# Patient Record
Sex: Male | Born: 1935 | Race: Black or African American | Hispanic: No | Marital: Married | Smoking: Former smoker
Health system: Southern US, Community
[De-identification: ages and names within clinical notes are randomized; demographics above are authoritative.]

## PROBLEM LIST (undated history)

## (undated) DIAGNOSIS — I10 Essential (primary) hypertension: Secondary | ICD-10-CM

## (undated) DIAGNOSIS — G4730 Sleep apnea, unspecified: Secondary | ICD-10-CM

## (undated) DIAGNOSIS — D649 Anemia, unspecified: Secondary | ICD-10-CM

## (undated) DIAGNOSIS — I739 Peripheral vascular disease, unspecified: Secondary | ICD-10-CM

## (undated) DIAGNOSIS — E039 Hypothyroidism, unspecified: Secondary | ICD-10-CM

## (undated) DIAGNOSIS — E119 Type 2 diabetes mellitus without complications: Secondary | ICD-10-CM

## (undated) DIAGNOSIS — D759 Disease of blood and blood-forming organs, unspecified: Secondary | ICD-10-CM

## (undated) DIAGNOSIS — N189 Chronic kidney disease, unspecified: Secondary | ICD-10-CM

## (undated) HISTORY — PX: APPENDECTOMY: SHX54

## (undated) HISTORY — PX: NECK SURGERY: SHX720

---

## 2007-03-02 ENCOUNTER — Ambulatory Visit: Payer: Self-pay | Admitting: Ophthalmology

## 2007-03-08 ENCOUNTER — Ambulatory Visit: Payer: Self-pay | Admitting: Ophthalmology

## 2007-12-05 ENCOUNTER — Other Ambulatory Visit: Payer: Self-pay

## 2007-12-05 ENCOUNTER — Ambulatory Visit: Payer: Self-pay | Admitting: Ophthalmology

## 2007-12-21 ENCOUNTER — Ambulatory Visit: Payer: Self-pay | Admitting: Ophthalmology

## 2008-03-19 ENCOUNTER — Ambulatory Visit: Payer: Self-pay | Admitting: Urology

## 2008-04-19 ENCOUNTER — Ambulatory Visit: Payer: Self-pay | Admitting: Urology

## 2008-04-19 ENCOUNTER — Other Ambulatory Visit: Payer: Self-pay

## 2009-02-26 ENCOUNTER — Ambulatory Visit: Payer: Self-pay | Admitting: Gastroenterology

## 2009-05-01 ENCOUNTER — Ambulatory Visit: Payer: Self-pay | Admitting: Urology

## 2009-05-14 ENCOUNTER — Ambulatory Visit: Payer: Self-pay | Admitting: Urology

## 2009-11-18 ENCOUNTER — Ambulatory Visit: Payer: Self-pay | Admitting: Unknown Physician Specialty

## 2012-12-09 ENCOUNTER — Ambulatory Visit: Payer: Self-pay | Admitting: Gastroenterology

## 2012-12-12 LAB — PATHOLOGY REPORT

## 2015-12-11 ENCOUNTER — Other Ambulatory Visit: Payer: Self-pay | Admitting: Internal Medicine

## 2015-12-11 DIAGNOSIS — H539 Unspecified visual disturbance: Secondary | ICD-10-CM

## 2015-12-21 ENCOUNTER — Ambulatory Visit
Admission: RE | Admit: 2015-12-21 | Discharge: 2015-12-21 | Disposition: A | Discharge status: Home / Self Care | Payer: Medicare Other | Source: Ambulatory Visit | Attending: Internal Medicine | Admitting: Internal Medicine

## 2015-12-21 DIAGNOSIS — H539 Unspecified visual disturbance: Secondary | ICD-10-CM

## 2015-12-31 ENCOUNTER — Ambulatory Visit: Payer: Self-pay

## 2016-02-21 ENCOUNTER — Encounter: Payer: Self-pay | Admitting: *Deleted

## 2016-02-24 ENCOUNTER — Encounter
Admission: RE | Disposition: A | Discharge status: Home / Self Care | Payer: Self-pay | Source: Ambulatory Visit | Attending: Gastroenterology

## 2016-02-24 ENCOUNTER — Ambulatory Visit: Payer: Medicare Other | Admitting: Anesthesiology

## 2016-02-24 ENCOUNTER — Ambulatory Visit
Admission: RE | Admit: 2016-02-24 | Discharge: 2016-02-24 | Disposition: A | Discharge status: Home / Self Care | Payer: Medicare Other | Source: Ambulatory Visit | Attending: Gastroenterology | Admitting: Gastroenterology

## 2016-02-24 DIAGNOSIS — N189 Chronic kidney disease, unspecified: Secondary | ICD-10-CM | POA: Insufficient documentation

## 2016-02-24 DIAGNOSIS — I739 Peripheral vascular disease, unspecified: Secondary | ICD-10-CM | POA: Diagnosis not present

## 2016-02-24 DIAGNOSIS — Z800 Family history of malignant neoplasm of digestive organs: Secondary | ICD-10-CM | POA: Insufficient documentation

## 2016-02-24 DIAGNOSIS — Z794 Long term (current) use of insulin: Secondary | ICD-10-CM | POA: Insufficient documentation

## 2016-02-24 DIAGNOSIS — E1122 Type 2 diabetes mellitus with diabetic chronic kidney disease: Secondary | ICD-10-CM | POA: Insufficient documentation

## 2016-02-24 DIAGNOSIS — Z7982 Long term (current) use of aspirin: Secondary | ICD-10-CM | POA: Diagnosis not present

## 2016-02-24 DIAGNOSIS — Z87891 Personal history of nicotine dependence: Secondary | ICD-10-CM | POA: Diagnosis not present

## 2016-02-24 DIAGNOSIS — D649 Anemia, unspecified: Secondary | ICD-10-CM | POA: Diagnosis not present

## 2016-02-24 DIAGNOSIS — Q438 Other specified congenital malformations of intestine: Secondary | ICD-10-CM | POA: Insufficient documentation

## 2016-02-24 DIAGNOSIS — K5730 Diverticulosis of large intestine without perforation or abscess without bleeding: Secondary | ICD-10-CM | POA: Insufficient documentation

## 2016-02-24 DIAGNOSIS — D125 Benign neoplasm of sigmoid colon: Secondary | ICD-10-CM | POA: Diagnosis not present

## 2016-02-24 DIAGNOSIS — E039 Hypothyroidism, unspecified: Secondary | ICD-10-CM | POA: Insufficient documentation

## 2016-02-24 DIAGNOSIS — Z86010 Personal history of colonic polyps: Secondary | ICD-10-CM | POA: Diagnosis present

## 2016-02-24 DIAGNOSIS — I129 Hypertensive chronic kidney disease with stage 1 through stage 4 chronic kidney disease, or unspecified chronic kidney disease: Secondary | ICD-10-CM | POA: Diagnosis not present

## 2016-02-24 DIAGNOSIS — D120 Benign neoplasm of cecum: Secondary | ICD-10-CM | POA: Diagnosis not present

## 2016-02-24 DIAGNOSIS — Z1211 Encounter for screening for malignant neoplasm of colon: Secondary | ICD-10-CM | POA: Insufficient documentation

## 2016-02-24 DIAGNOSIS — K529 Noninfective gastroenteritis and colitis, unspecified: Secondary | ICD-10-CM | POA: Insufficient documentation

## 2016-02-24 DIAGNOSIS — D123 Benign neoplasm of transverse colon: Secondary | ICD-10-CM | POA: Diagnosis not present

## 2016-02-24 DIAGNOSIS — Z79899 Other long term (current) drug therapy: Secondary | ICD-10-CM | POA: Diagnosis not present

## 2016-02-24 DIAGNOSIS — M109 Gout, unspecified: Secondary | ICD-10-CM | POA: Insufficient documentation

## 2016-02-24 DIAGNOSIS — G4730 Sleep apnea, unspecified: Secondary | ICD-10-CM | POA: Insufficient documentation

## 2016-02-24 HISTORY — PX: COLONOSCOPY WITH PROPOFOL: SHX5780

## 2016-02-24 HISTORY — DX: Sleep apnea, unspecified: G47.30

## 2016-02-24 HISTORY — DX: Anemia, unspecified: D64.9

## 2016-02-24 HISTORY — DX: Peripheral vascular disease, unspecified: I73.9

## 2016-02-24 HISTORY — DX: Hypothyroidism, unspecified: E03.9

## 2016-02-24 HISTORY — DX: Chronic kidney disease, unspecified: N18.9

## 2016-02-24 HISTORY — DX: Disease of blood and blood-forming organs, unspecified: D75.9

## 2016-02-24 HISTORY — DX: Essential (primary) hypertension: I10

## 2016-02-24 HISTORY — DX: Type 2 diabetes mellitus without complications: E11.9

## 2016-02-24 LAB — GLUCOSE, CAPILLARY: GLUCOSE-CAPILLARY: 115 mg/dL — AB (ref 65–99)

## 2016-02-24 SURGERY — COLONOSCOPY WITH PROPOFOL
Anesthesia: General

## 2016-02-24 MED ORDER — SODIUM CHLORIDE 0.9 % IV SOLN: INTRAVENOUS | Status: DC

## 2016-02-24 MED ORDER — LIDOCAINE HCL (CARDIAC) 20 MG/ML IV SOLN
INTRAVENOUS | Status: DC | PRN
  Administered 2016-02-24: 30 mg via INTRAVENOUS

## 2016-02-24 MED ORDER — EPHEDRINE SULFATE 50 MG/ML IJ SOLN
INTRAMUSCULAR | Status: DC | PRN
  Administered 2016-02-24: 5 mg via INTRAVENOUS

## 2016-02-24 MED ORDER — PROPOFOL 500 MG/50ML IV EMUL
INTRAVENOUS | Status: DC | PRN
Start: 2016-02-24 — End: 2016-02-24
  Administered 2016-02-24: 160 ug/kg/min via INTRAVENOUS

## 2016-02-24 MED ORDER — SODIUM CHLORIDE 0.9 % IV SOLN
INTRAVENOUS | Status: DC
  Administered 2016-02-24: 1000 mL via INTRAVENOUS

## 2016-02-24 MED ORDER — MIDAZOLAM HCL 5 MG/5ML IJ SOLN
INTRAMUSCULAR | Status: DC | PRN
  Administered 2016-02-24: 1 mg via INTRAVENOUS

## 2016-02-24 MED ORDER — FENTANYL CITRATE (PF) 100 MCG/2ML IJ SOLN
INTRAMUSCULAR | Status: DC | PRN
  Administered 2016-02-24: 50 ug via INTRAVENOUS

## 2016-02-24 MED ORDER — PROPOFOL 10 MG/ML IV BOLUS
INTRAVENOUS | Status: DC | PRN
Start: 2016-02-24 — End: 2016-02-24
  Administered 2016-02-24: 50 mg via INTRAVENOUS

## 2016-02-24 MED ORDER — LABETALOL HCL 5 MG/ML IV SOLN
INTRAVENOUS | Status: DC | PRN
  Administered 2016-02-24 (×2): 5 mg via INTRAVENOUS
  Administered 2016-02-24: 10 mg via INTRAVENOUS

## 2016-02-24 NOTE — H&P (Signed)
Outpatient short stay form Pre-procedure 02/24/2016 7:34 AM Lollie Sails MD  Primary Physician: Dr. Ramonita Lab  Reason for visit:  Colonoscopy  History of present illness:  Patient is a 82 year old male presenting today for colonoscopy. He has a family history of colon cancer primary relative as well as personal history of adenomatous colon polyps. He tolerated his prep well. He takes a 81 mg aspirin that has been held for several days. He takes no other aspirin or blood thinning products.    Current facility-administered medications:  .  0.9 %  sodium chloride infusion, , Intravenous, Continuous, Lollie Sails, MD, Last Rate: 20 mL/hr at 02/24/16 0704, 1,000 mL at 02/24/16 0704 .  0.9 %  sodium chloride infusion, , Intravenous, Continuous, Lollie Sails, MD  Prescriptions prior to admission  Medication Sig Dispense Refill Last Dose  . aspirin (ASPIRIN EC) 81 MG EC tablet Take 81 mg by mouth daily. Swallow whole.     Marland Kitchen atorvastatin (LIPITOR) 40 MG tablet Take 40 mg by mouth daily.     . bimatoprost (LUMIGAN) 0.03 % ophthalmic solution Place 1 drop into both eyes at bedtime.     . colchicine 0.6 MG tablet Take 0.6 mg by mouth as needed.     . docusate calcium (SURFAK) 240 MG capsule Take 240 mg by mouth daily as needed for mild constipation.     . dorzolamide-timolol (COSOPT) 22.3-6.8 MG/ML ophthalmic solution Place 1 drop into both eyes 2 (two) times daily.     . finasteride (PROSCAR) 5 MG tablet Take 5 mg by mouth daily.     . insulin glargine (LANTUS) 100 UNIT/ML injection Inject 18 Units into the skin at bedtime.     Marland Kitchen ketorolac (ACULAR) 0.4 % SOLN 1 drop 4 (four) times daily.     Marland Kitchen latanoprost (XALATAN) 0.005 % ophthalmic solution Place 1 drop into both eyes at bedtime.     Marland Kitchen levothyroxine (SYNTHROID, LEVOTHROID) 75 MCG tablet Take 75 mcg by mouth daily before breakfast.     . prednisoLONE acetate (PRED FORTE) 1 % ophthalmic suspension 1 drop 4 (four) times daily.     Marland Kitchen  pyridOXINE (VITAMIN B-6) 100 MG tablet Take 100 mg by mouth daily.     . tamsulosin (FLOMAX) 0.4 MG CAPS capsule Take 0.4 mg by mouth 2 (two) times daily.     . valsartan (DIOVAN) 160 MG tablet Take 160 mg by mouth daily.        Allergies  Allergen Reactions  . Actos [Pioglitazone]   . Avandia [Rosiglitazone]      Past Medical History  Diagnosis Date  . Diabetes mellitus without complication ([place])   . Anemia   . Chronic kidney disease   . Hypertension   . Peripheral vascular disease ([place])   . Hypothyroidism   . Blood dyscrasia   . Sleep apnea     Review of systems:      Physical Exam    Heart and lungs: Regular rate and rhythm without rub or gallop, lungs are bilaterally clear.    HEENT: Normocephalic atraumatic eyes are anicteric    Other:     Pertinant exam for procedure: Soft protuberant nontender bowel sounds are positive normoactive.    Planned proceedures: Colonoscopy and indicated procedures. I have discussed the risks benefits and complications of procedures to include not limited to bleeding, infection, perforation and the risk of sedation and the patient wishes to proceed.    Lollie Sails, MD Gastroenterology 02/24/2016  7:34 AM

## 2016-02-24 NOTE — Anesthesia Postprocedure Evaluation (Signed)
Anesthesia Post Note  Patient: Dennis Robles  Procedure(s) Performed: Procedure(s) (LRB): COLONOSCOPY WITH PROPOFOL (N/A)  Patient location during evaluation: Endoscopy Anesthesia Type: General Level of consciousness: awake and alert Pain management: pain level controlled Vital Signs Assessment: post-procedure vital signs reviewed and stable Respiratory status: spontaneous breathing, nonlabored ventilation, respiratory function stable and patient connected to nasal cannula oxygen Cardiovascular status: blood pressure returned to baseline and stable Postop Assessment: no signs of nausea or vomiting Anesthetic complications: no    Last Vitals:  Filed Vitals:   02/24/16 0916 02/24/16 0926  BP: 116/57 130/60  Pulse: 79 78  Temp:    Resp: 19 20    Last Pain: There were no vitals filed for this visit.               Huntsman,Judianne Seiple S

## 2016-02-24 NOTE — Transfer of Care (Signed)
Immediate Anesthesia Transfer of Care Note  Patient: Dennis Robles  Procedure(s) Performed: Procedure(s): COLONOSCOPY WITH PROPOFOL (N/A)  Patient Location: PACU and Short Stay  Anesthesia Type:General  Level of Consciousness: sedated  Airway & Oxygen Therapy: Patient Spontanous Breathing and Patient connected to nasal cannula oxygen  Post-op Assessment: Report given to RN and Post -op Vital signs reviewed and stable  Post vital signs: Reviewed and stable  Last Vitals:  Filed Vitals:   02/24/16 0644  BP: 163/65  Pulse: 94  Temp: 36.2 C  Resp: 18    Complications: No apparent anesthesia complications

## 2016-02-24 NOTE — Op Note (Signed)
[facility] Gastroenterology Patient Name: Dennis Robles Procedure Date: 02/24/2016 7:36 AM MRN: [id number] Account #: [id number] Date of Birth: January 09, 1931 Admit Type: Outpatient Age: 82 Room: [facility] ENDO ROOM 3 Gender: Male Note Status: Finalized Procedure:            Colonoscopy Indications:          Family history of colon cancer in a first-degree                        relative, Personal history of colonic polyps Providers:            Lollie Sails, MD Referring MD:         Ramonita Lab, MD (Referring MD) Medicines:            Monitored Anesthesia Care Complications:        No immediate complications. Procedure:            Pre-Anesthesia Assessment:                       - ASA Grade Assessment: III - A patient with severe                        systemic disease.                       After obtaining informed consent, the colonoscope was                        passed under direct vision. Throughout the procedure,                        the patient's blood pressure, pulse, and oxygen                        saturations were monitored continuously. The Olympus                        PCF-H180AL colonoscope ( S#: [id number] ) was introduced                        through the anus and advanced to the the cecum,                        identified by appendiceal orifice and ileocecal valve.                        The patient tolerated the procedure. The quality of the                        bowel preparation was fair. Findings:      Multiple small-mouthed diverticula were found in the sigmoid colon and       descending colon.      A patchy area of mildly eroded (linear-pattern) mucosa was found in the       proximal descending colon. Biopsies were taken with a cold forceps for       histology.      A 4 mm polyp was found in the hepatic flexure. The polyp was sessile.       The polyp was removed with a cold snare. Resection and retrieval were  complete.      Two flat  polyps were found in the hepatic flexure. The polyps were 3 to       4 mm in size. These polyps were removed with a cold snare. Resection and       retrieval were complete.      A 2 mm polyp was found in the cecum. The polyp was sessile. The polyp       was removed with a cold biopsy forceps. Resection and retrieval were       complete.      Two sessile polyps were found in the hepatic flexure. The polyps were 2       to 3 mm in size. These polyps were removed with a cold biopsy forceps.       Resection and retrieval were complete.      A 2 mm polyp was found in the transverse colon. The polyp was flat. The       polyp was removed with a cold biopsy forceps. Resection and retrieval       were complete.      A 3 mm polyp was found in the proximal sigmoid colon. The polyp was       flat. The polyp was removed with a cold biopsy forceps. Resection and       retrieval were complete.      The descending colon and transverse colon were moderately redundant.      The digital rectal exam was normal. Impression:           - Preparation of the colon was fair.                       - Diverticulosis in the sigmoid colon and in the                        descending colon.                       - Eroded (linear-pattern) mucosa in the proximal                        descending colon. Biopsied.                       - One 4 mm polyp at the hepatic flexure, removed with a                        cold snare. Resected and retrieved.                       - Two 3 to 4 mm polyps at the hepatic flexure, removed                        with a cold snare. Resected and retrieved.                       - One 2 mm polyp in the cecum, removed with a cold                        biopsy forceps. Resected and retrieved.                       - Two  2 to 3 mm polyps at the hepatic flexure, removed                        with a cold biopsy forceps. Resected and retrieved.                       - One 2 mm polyp in the  transverse colon, removed with                        a cold biopsy forceps. Resected and retrieved.                       - One 3 mm polyp in the proximal sigmoid colon, removed                        with a cold biopsy forceps. Resected and retrieved.                       - Redundant colon. Recommendation:       - Discharge patient to home. Procedure Code(s):    --- Professional ---                       [phone], Colonoscopy, flexible; with removal of tumor(s),                        polyp(s), or other lesion(s) by snare technique                       45380, 48, Colonoscopy, flexible; with biopsy, single                        or multiple Diagnosis Code(s):    --- Professional ---                       K63.89, Other specified diseases of intestine                       D12.0, Benign neoplasm of cecum                       D12.3, Benign neoplasm of transverse colon (hepatic                        flexure or splenic flexure)                       D12.5, Benign neoplasm of sigmoid colon                       Z80.0, Family history of malignant neoplasm of                        digestive organs                       Z86.010, Personal history of colonic polyps                       K57.30, Diverticulosis of large intestine without  perforation or abscess without bleeding                       Q43.8, Other specified congenital malformations of                        intestine CPT copyright 2016 [employer]. All rights reserved. The codes documented in this report are preliminary and upon coder review may  be revised to meet current compliance requirements. Lollie Sails, MD 02/24/2016 8:42:26 AM This report has been signed electronically. Number of Addenda: 0 Note Initiated On: 02/24/2016 7:36 AM Scope Withdrawal Time: 0 hours 23 minutes 51 seconds  Total Procedure Duration: 0 hours 52 minutes 5 seconds       [facility]

## 2016-02-24 NOTE — Anesthesia Preprocedure Evaluation (Addendum)
Anesthesia Evaluation  Patient identified by MRN, date of birth, ID band Patient awake    Reviewed: Allergy & Precautions, NPO status , Patient's Chart, lab work & pertinent test results, reviewed documented beta blocker date and time   Airway Mallampati: II  TM Distance: >3 FB     Dental  (+) Chipped, Poor Dentition, Missing   Pulmonary sleep apnea , former smoker,           Cardiovascular hypertension, Pt. on medications + Peripheral Vascular Disease       Neuro/Psych    GI/Hepatic   Endo/Other  diabetesHypothyroidism   Renal/GU Renal InsufficiencyRenal disease     Musculoskeletal   Abdominal   Peds  Hematology  (+) anemia ,   Anesthesia Other Findings Gout. EKG ok. Does not think he has sleep apnea.  Reproductive/Obstetrics                            Anesthesia Physical Anesthesia Plan  ASA: III  Anesthesia Plan: General   Post-op Pain Management:    Induction: Intravenous  Airway Management Planned: Nasal Cannula  Additional Equipment:   Intra-op Plan:   Post-operative Plan:   Informed Consent: I have reviewed the patients History and Physical, chart, labs and discussed the procedure including the risks, benefits and alternatives for the proposed anesthesia with the patient or authorized representative who has indicated his/her understanding and acceptance.     Plan Discussed with: CRNA  Anesthesia Plan Comments:         Anesthesia Quick Evaluation

## 2016-02-25 LAB — SURGICAL PATHOLOGY

## 2017-03-05 IMAGING — MR MR HEAD W/O CM
10 series · 48 of 48 positions shown · non-contrast
Comparison: None.

CLINICAL DATA: Visual changes. Seeing spots in RIGHT eye. Myeloma.

EXAM:
MRI HEAD WITHOUT CONTRAST
TECHNIQUE: Multiplanar, multiecho pulse sequences of the brain and surrounding
structures were obtained without intravenous contrast.

[Series 5: T1 · sagittal · 4.0mm · 0.75mm/px · 2 of 28 slices shown (1 of 2)]
[im 1/28]
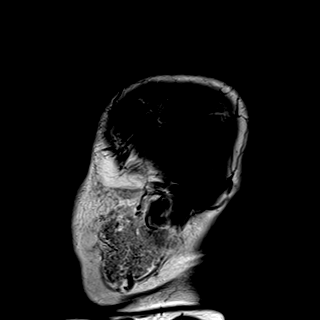
[im 28/28]
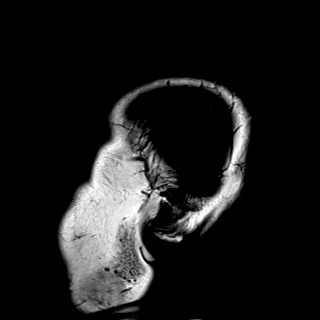

[Series 6: DWI · axial · 3.0mm · 1.44mm/px · z∈[-149,-16]mm · 7 of 86 slices shown (1 of 4)]
[im 1/86]
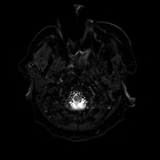
[im 15/86]
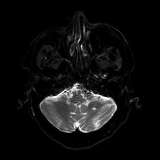
[im 29/86]
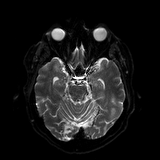
[im 43/86]
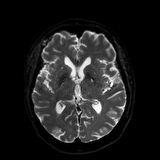
[im 57/86]
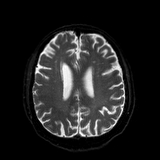
[im 71/86]
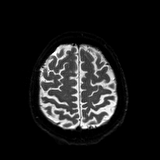
[im 86/86]
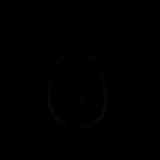

[Series 7: DWI · axial · 3.0mm · 1.44mm/px · z∈[-149,-16]mm · 4 of 43 slices shown (2 of 4)]
[im 1/43]
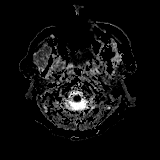
[im 15/43]
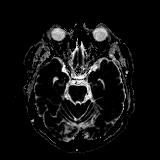
[im 29/43]
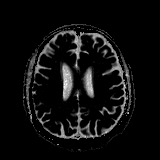
[im 43/43]
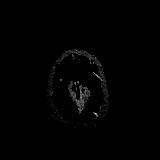

[Series 8: DWI · coronal · 5.0mm · 1.44mm/px · 5 of 60 slices shown (3 of 4)]
[im 1/60]
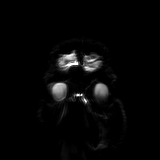
[im 15/60]
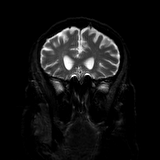
[im 30/60]
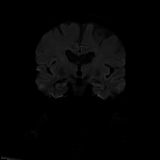
[im 45/60]
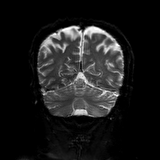
[im 60/60]
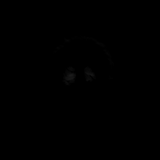

[Series 9: DWI · coronal · 5.0mm · 1.44mm/px · 3 of 30 slices shown (4 of 4)]
[im 1/30]
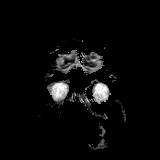
[im 15/30]
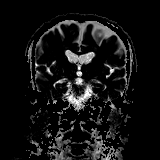
[im 30/30]
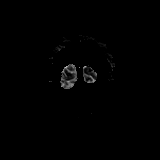

[Series 10: T2 · axial · 4.0mm · 0.36mm/px · z∈[-151,-17]mm · 2 of 28 slices shown (1 of 2)]
[im 1/28]
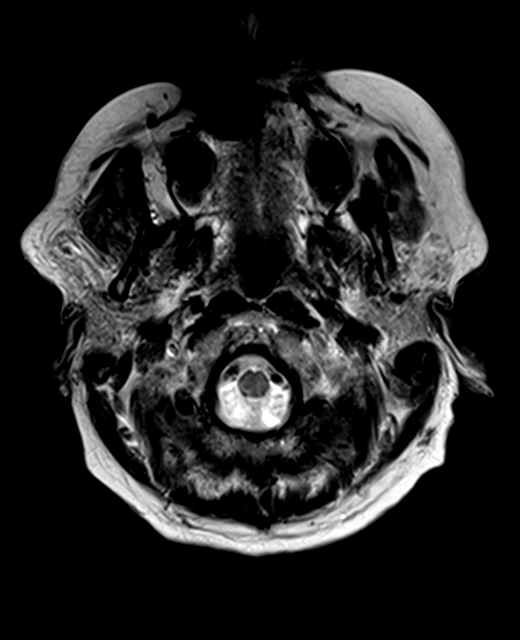
[im 28/28]
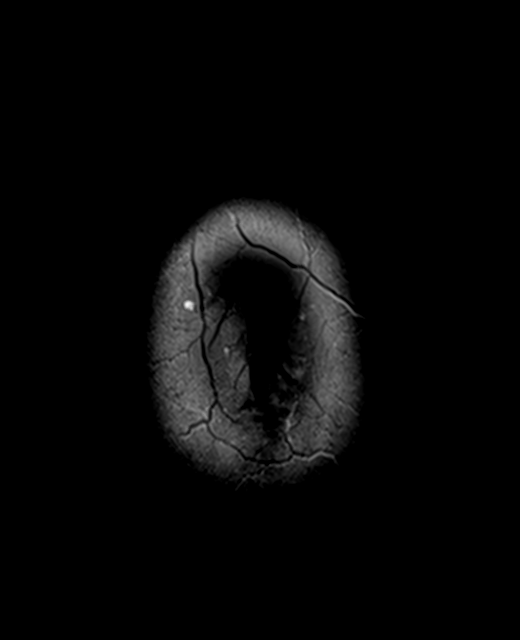

[Series 11: FLAIR · axial · 4.0mm · 0.72mm/px · z∈[-152,-18]mm · 2 of 28 slices shown]
[im 1/28]
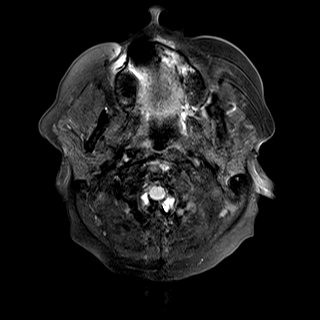
[im 28/28]
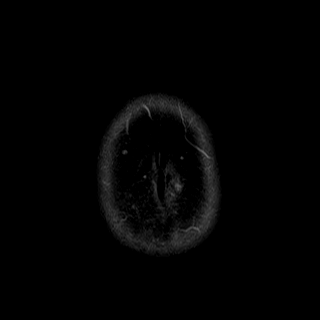

[Series 13: swi_images · axial · 1.5mm · 0.90mm/px · z∈[-152,-16]mm · 8 of 96 slices shown]
[im 1/96]
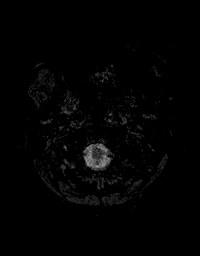
[im 14/96]
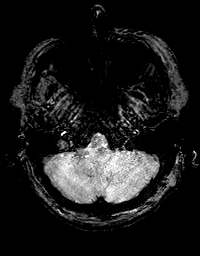
[im 28/96]
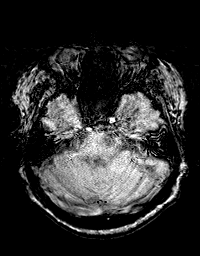
[im 41/96]
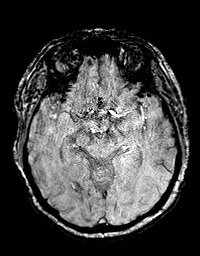
[im 55/96]
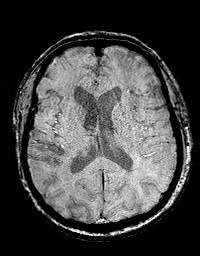
[im 68/96]
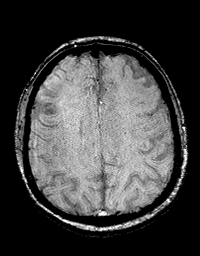
[im 82/96]
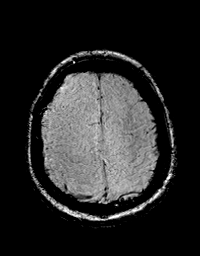
[im 96/96]
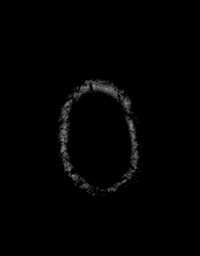

[Series 14: T1 · axial · 1.0mm · 0.90mm/px · z∈[-152,-15]mm · 12 of 144 slices shown (2 of 2)]
[im 1/144]
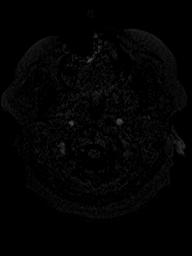
[im 14/144]
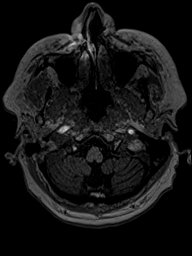
[im 27/144]
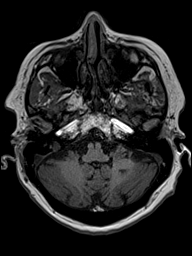
[im 40/144]
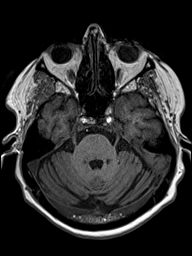
[im 53/144]
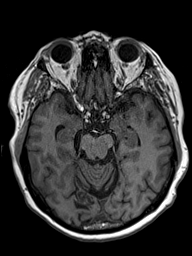
[im 66/144]
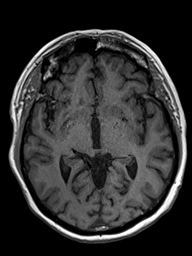
[im 79/144]
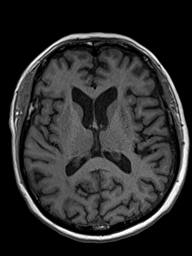
[im 92/144]
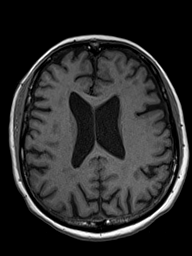
[im 105/144]
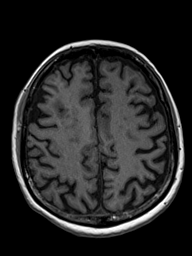
[im 118/144]
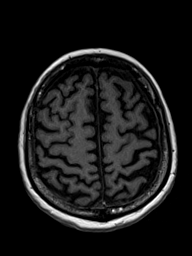
[im 131/144]
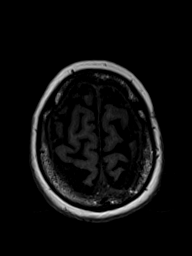
[im 144/144]
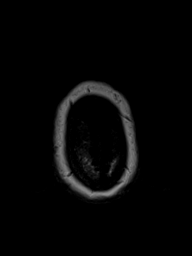

[Series 15: T2 · coronal · 4.5mm · 0.36mm/px · 3 of 30 slices shown (2 of 2)]
[im 1/30]
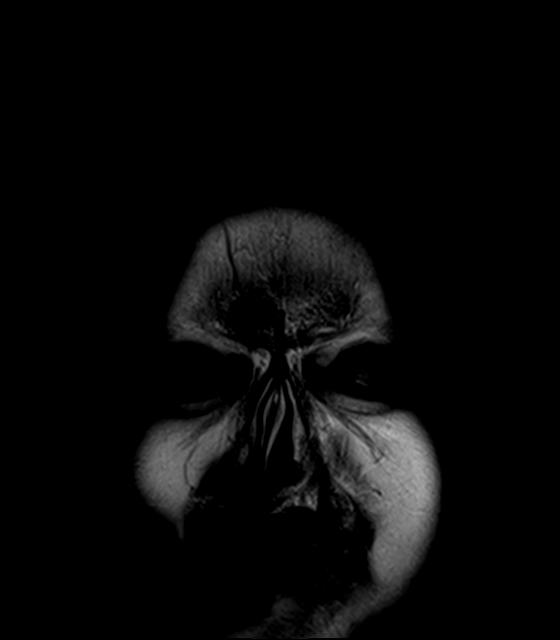
[im 15/30]
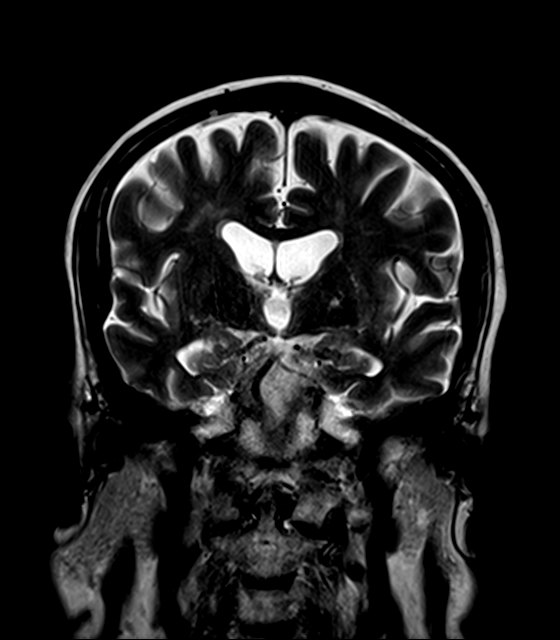
[im 30/30]
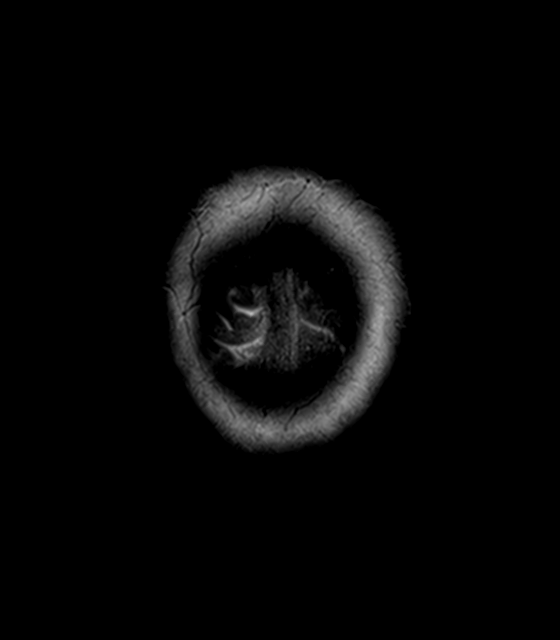

[48 of 48 positions shown; findings below may reference images not displayed]

FINDINGS: No evidence for acute infarction, hemorrhage, mass lesion,
hydrocephalus, or extra-axial fluid. Generalized atrophy. Mild to
moderate T2 and FLAIR hyperintensities throughout the white matter,
likely chronic microvascular ischemic change.

Old RIGHT frontal infarct.  Old LEFT cerebellar peduncle infarct.

Flow voids are maintained throughout the carotid, basilar, and
vertebral arteries. There are no areas of chronic hemorrhage.

Pineal and cerebellar tonsils unremarkable. Partial empty sella. No
upper cervical lesions. No features suggestive of myeloma at the
skull base, upper cervical region or calvarium.

BILATERAL cataract extraction. No sinus or mastoid disease. Scalp
soft tissues unremarkable.
IMPRESSION: Atrophy and small vessel disease. Evidence for remote cerebral
ischemia.

No acute intracranial findings.

No osseous findings to suggest myeloma in the calvarium, skullbase,
or upper cervical region.

## 2018-01-21 DEATH — deceased

## 2018-12-21 DEATH — deceased
